# Patient Record
Sex: Female | Born: 1970 | Race: White | Hispanic: No | Marital: Married | Smoking: Current every day smoker
Health system: Southern US, Community
[De-identification: ages and names within clinical notes are randomized; demographics above are authoritative.]

## PROBLEM LIST (undated history)

## (undated) DIAGNOSIS — J449 Chronic obstructive pulmonary disease, unspecified: Secondary | ICD-10-CM

## (undated) DIAGNOSIS — N289 Disorder of kidney and ureter, unspecified: Secondary | ICD-10-CM

## (undated) DIAGNOSIS — D649 Anemia, unspecified: Secondary | ICD-10-CM

## (undated) HISTORY — PX: ORIF KNEE DISLOCATION: SUR938

## (undated) HISTORY — PX: ABDOMINAL HYSTERECTOMY: SHX81

---

## 1998-08-21 ENCOUNTER — Inpatient Hospital Stay (HOSPITAL_COMMUNITY): Admission: EM | Admit: 1998-08-21 | Discharge: 1998-08-29 | Payer: Self-pay | Admitting: *Deleted

## 1998-08-21 ENCOUNTER — Encounter: Payer: Self-pay | Admitting: *Deleted

## 1998-08-21 ENCOUNTER — Encounter: Payer: Self-pay | Admitting: General Surgery

## 1998-08-23 ENCOUNTER — Encounter: Payer: Self-pay | Admitting: General Surgery

## 2006-02-27 ENCOUNTER — Encounter: Payer: Self-pay | Admitting: *Deleted

## 2006-02-27 ENCOUNTER — Encounter: Payer: Self-pay | Admitting: Orthopedic Surgery

## 2010-01-23 DIAGNOSIS — Z720 Tobacco use: Secondary | ICD-10-CM | POA: Insufficient documentation

## 2011-02-08 DIAGNOSIS — F319 Bipolar disorder, unspecified: Secondary | ICD-10-CM | POA: Insufficient documentation

## 2011-05-07 DIAGNOSIS — D649 Anemia, unspecified: Secondary | ICD-10-CM | POA: Insufficient documentation

## 2011-10-01 DIAGNOSIS — R7301 Impaired fasting glucose: Secondary | ICD-10-CM | POA: Insufficient documentation

## 2013-07-23 ENCOUNTER — Emergency Department (HOSPITAL_COMMUNITY): Payer: Medicaid - Out of State

## 2013-07-23 ENCOUNTER — Encounter (HOSPITAL_COMMUNITY): Payer: Self-pay | Admitting: Emergency Medicine

## 2013-07-23 ENCOUNTER — Emergency Department (HOSPITAL_COMMUNITY)
Admission: EM | Admit: 2013-07-23 | Discharge: 2013-07-24 | Disposition: A | Discharge status: Home / Self Care | Payer: Medicaid - Out of State | Attending: Emergency Medicine | Admitting: Emergency Medicine

## 2013-07-23 DIAGNOSIS — D649 Anemia, unspecified: Secondary | ICD-10-CM | POA: Insufficient documentation

## 2013-07-23 DIAGNOSIS — J449 Chronic obstructive pulmonary disease, unspecified: Secondary | ICD-10-CM | POA: Insufficient documentation

## 2013-07-23 DIAGNOSIS — J4489 Other specified chronic obstructive pulmonary disease: Secondary | ICD-10-CM | POA: Insufficient documentation

## 2013-07-23 DIAGNOSIS — Z3202 Encounter for pregnancy test, result negative: Secondary | ICD-10-CM | POA: Insufficient documentation

## 2013-07-23 DIAGNOSIS — F17200 Nicotine dependence, unspecified, uncomplicated: Secondary | ICD-10-CM | POA: Insufficient documentation

## 2013-07-23 DIAGNOSIS — Z87448 Personal history of other diseases of urinary system: Secondary | ICD-10-CM | POA: Insufficient documentation

## 2013-07-23 DIAGNOSIS — IMO0002 Reserved for concepts with insufficient information to code with codable children: Secondary | ICD-10-CM | POA: Insufficient documentation

## 2013-07-23 DIAGNOSIS — K8020 Calculus of gallbladder without cholecystitis without obstruction: Secondary | ICD-10-CM | POA: Insufficient documentation

## 2013-07-23 DIAGNOSIS — Z79899 Other long term (current) drug therapy: Secondary | ICD-10-CM | POA: Insufficient documentation

## 2013-07-23 DIAGNOSIS — R109 Unspecified abdominal pain: Secondary | ICD-10-CM

## 2013-07-23 HISTORY — DX: Anemia, unspecified: D64.9

## 2013-07-23 HISTORY — DX: Disorder of kidney and ureter, unspecified: N28.9

## 2013-07-23 HISTORY — DX: Chronic obstructive pulmonary disease, unspecified: J44.9

## 2013-07-23 MED ORDER — KETOROLAC TROMETHAMINE 30 MG/ML IJ SOLN
30.0000 mg | Freq: Once | INTRAMUSCULAR | Status: AC
  Administered 2013-07-24: 30 mg via INTRAVENOUS
  Filled 2013-07-23: qty 1

## 2013-07-23 MED ORDER — HYDROMORPHONE HCL PF 1 MG/ML IJ SOLN
1.0000 mg | Freq: Once | INTRAMUSCULAR | Status: AC
  Administered 2013-07-24: 1 mg via INTRAVENOUS
  Filled 2013-07-23: qty 1

## 2013-07-23 MED ORDER — SODIUM CHLORIDE 0.9 % IV BOLUS (SEPSIS)
1000.0000 mL | Freq: Once | INTRAVENOUS | Status: AC
  Administered 2013-07-24: 1000 mL via INTRAVENOUS

## 2013-07-23 MED ORDER — ONDANSETRON HCL 4 MG/2ML IJ SOLN
4.0000 mg | Freq: Once | INTRAMUSCULAR | Status: AC
  Administered 2013-07-24: 4 mg via INTRAVENOUS
  Filled 2013-07-23: qty 2

## 2013-07-23 NOTE — ED Provider Notes (Signed)
CSN: [id number]     Arrival date & time 07/23/13  2322 History   First MD Initiated Contact with Patient 07/23/13 2333     Chief Complaint  Patient presents with  . Abdominal Pain  . Nausea  . Emesis   (Consider location/radiation/quality/duration/timing/severity/associated sxs/prior Treatment) HPI  42 year old female with abdominal pain and nausea vomiting. Symptoms have waxed and waned for several months. Currently worse the last 2 days. Pain is in the right upper quadrant/right flank with radiation into the pack. Waxes and wanes. Sometimes worse after eating, but not consistently. No fevers or chills. Has better small amount of blood in the urine. Otherwise, no urinary complaints. No unusual vaginal bleeding or discharge.  No fevers or chills. Patient reports she was seen at [facility] recently for the same complaints. She reports that she was told she likely has a kidney stone. Surgical history significant for hysterectomy. Has been taking Naprosyn with some relief.  Past Medical History  Diagnosis Date  . COPD (chronic obstructive pulmonary disease)   . Renal disorder   . Anemia    Past Surgical History  Procedure Laterality Date  . Orif knee dislocation    . Abdominal hysterectomy     History reviewed. No pertinent family history. History  Substance Use Topics  . Smoking status: Current Every Day Smoker  . Smokeless tobacco: Not on file  . Alcohol Use: Yes   OB History   Grav Para Term Preterm Abortions TAB SAB Ect Mult Living                 Review of Systems  All systems reviewed and negative, other than as noted in HPI.   Allergies  Cinnamon  Home Medications   Current Outpatient Rx  Name  Route  Sig  Dispense  Refill  . ferrous fumarate (HEMOCYTE - 106 MG FE) 325 (106 FE) MG TABS tablet   Oral   Take 1 tablet by mouth.         . folic acid (FOLVITE) 1 MG tablet   Oral   Take 1 mg by mouth daily.         . naproxen (NAPROSYN) 500 MG  tablet   Oral   Take 500 mg by mouth 2 (two) times daily with a meal.         . pantoprazole (PROTONIX) 40 MG tablet   Oral   Take 40 mg by mouth daily.          BP 119/71  Pulse 107  Temp(Src) 97.9 F (36.6 C) (Oral)  Ht 5\' 2"  (1.575 m)  Wt 168 lb (76.204 kg)  BMI 30.72 kg/m2  SpO2 99% Physical Exam  Nursing note and vitals reviewed. Constitutional: She appears well-developed and well-nourished. No distress.  HENT:  Head: Normocephalic and atraumatic.  Eyes: Conjunctivae are normal. Right eye exhibits no discharge. Left eye exhibits no discharge.  Neck: Neck supple.  Cardiovascular: Normal rate, regular rhythm and normal heart sounds.  Exam reveals no gallop and no friction rub.   No murmur heard. Pulmonary/Chest: Effort normal and breath sounds normal. No respiratory distress.  Abdominal: Soft. She exhibits no distension. There is tenderness. There is no rebound and no guarding.  Epigastric and right upper quadrant tenderness without rebound or guarding. No distention. Well-healed lower midline surgical scar. Bedside ultrasound of the right upper quadrant shows a dependent gallstone in the gallbladder body. Gallbladder neck not well visualized but no obvious stone noted. Gallbladder wall 2.1 mm. No  pericholecystic fluid. Common bile duct not clearly identified.  Genitourinary:  Right costovertebral angle tenderness.  Musculoskeletal: She exhibits no edema and no tenderness.  Neurological: She is alert.  Skin: Skin is warm and dry.  Psychiatric: She has a normal mood and affect. Her behavior is normal. Thought content normal.    ED Course  Procedures (including critical care time) Labs Review Labs Reviewed  URINALYSIS, ROUTINE W REFLEX MICROSCOPIC - Abnormal; Notable for the following:    Hgb urine dipstick SMALL (*)    All other components within normal limits  CBC WITH DIFFERENTIAL - Abnormal; Notable for the following:    WBC 11.6 (*)    Lymphs Abs 4.1 (*)     All other components within normal limits  BASIC METABOLIC PANEL - Abnormal; Notable for the following:    Glucose, Bld 108 (*)    All other components within normal limits  URINE MICROSCOPIC-ADD ON - Abnormal; Notable for the following:    Squamous Epithelial / LPF MANY (*)    Bacteria, UA MANY (*)    All other components within normal limits  HEPATIC FUNCTION PANEL - Abnormal; Notable for the following:    ALT 57 (*)    Total Bilirubin <0.1 (*)    All other components within normal limits  LIPASE, BLOOD - Abnormal; Notable for the following:    Lipase 131 (*)    All other components within normal limits  PREGNANCY, URINE   Imaging Review Ct Abdomen Pelvis Wo Contrast  07/24/2013   CLINICAL DATA:  Right flank pain, nausea and vomiting. History of urinary tract stones.  EXAM: CT ABDOMEN AND PELVIS WITHOUT CONTRAST  TECHNIQUE: Multidetector CT imaging of the abdomen and pelvis was performed following the standard protocol without intravenous contrast.  COMPARISON:  CT abdomen and pelvis 05/15/2013.  FINDINGS: The lung bases are clear. No pleural or pericardial effusion.  There are no renal or ureteral stones on the right or left. The kidneys have a normal in noncontrast appearance. The urinary bladder appears normal. The liver is diffusely low attenuating consistent with fatty infiltration. No focal liver lesion is seen. The spleen, adrenal glands, pancreas, gallbladder and biliary tree all appear normal. The patient is status post hysterectomy. The stomach, small and large bowel and appendix appear normal. There is no lymphadenopathy or fluid. No focal bony abnormality is identified.  IMPRESSION: Negative for urinary tract stone or hydronephrosis. No acute abnormality.  Diffuse fatty infiltration of the liver.   Electronically Signed   By: Drusilla Kanner M.D.   On: 07/24/2013 00:44    MDM   1. Abdominal pain   2. Cholelithiasis    42 year old female with abdominal pain. I suspect this is  secondary to biliary colic. Patient with a dependent gallstone noted on bedside ultrasound. Gallbladder wall was not thickened and there was no pericholecystic fluid. I did not get a great view of the gallbladder neck nor convincing view of the common bile duct though. Arranged for patient return for a formal ultrasound later this morning. She does have a mild elevation in her lipase/ALT. Her symptoms have been controlled though. Repeat abdominal exam prior to DC much improved. This is more consistent with transient obstruction. Urinalysis shows many bacteria but also many squamous cells. Nitrite and leukocyte negative. No specific urinary complaints. Antibiotics deferred. I feel she is stable for discharge. Noncontrast CT is without acute pathology. She is discharged with pain medication and Zofran. She understands the need to return for ultrasound later  today. Discussed the possibility that she may be developing gallstone pancreatitis. Strict return precautions discussed. Surgical follow-up contact information provided otherwise.    Raeford Razor, MD 07/24/13 [phone]

## 2013-07-23 NOTE — ED Notes (Signed)
MD at bedside. 

## 2013-07-23 NOTE — ED Notes (Signed)
Pt states this feels like a kidney stone. Pt c/o n/v with abd pain and states she had a hard stool this am.

## 2013-07-23 NOTE — ED Notes (Signed)
Pt up to restroom at this time to obtain clean catch urine specimen

## 2013-07-24 ENCOUNTER — Encounter (HOSPITAL_COMMUNITY): Payer: Self-pay

## 2013-07-24 ENCOUNTER — Emergency Department (HOSPITAL_COMMUNITY)
Admission: EM | Admit: 2013-07-24 | Discharge: 2013-07-24 | Disposition: A | Discharge status: Home / Self Care | Payer: Medicaid - Out of State | Source: Home / Self Care | Attending: Emergency Medicine | Admitting: Emergency Medicine

## 2013-07-24 ENCOUNTER — Ambulatory Visit (HOSPITAL_COMMUNITY)
Admit: 2013-07-24 | Discharge: 2013-07-24 | Disposition: A | Discharge status: Home / Self Care | Payer: Medicaid - Out of State | Attending: Emergency Medicine | Admitting: Emergency Medicine

## 2013-07-24 DIAGNOSIS — F17200 Nicotine dependence, unspecified, uncomplicated: Secondary | ICD-10-CM | POA: Insufficient documentation

## 2013-07-24 DIAGNOSIS — F411 Generalized anxiety disorder: Secondary | ICD-10-CM | POA: Insufficient documentation

## 2013-07-24 DIAGNOSIS — D649 Anemia, unspecified: Secondary | ICD-10-CM | POA: Insufficient documentation

## 2013-07-24 DIAGNOSIS — K8020 Calculus of gallbladder without cholecystitis without obstruction: Secondary | ICD-10-CM

## 2013-07-24 DIAGNOSIS — J449 Chronic obstructive pulmonary disease, unspecified: Secondary | ICD-10-CM | POA: Insufficient documentation

## 2013-07-24 DIAGNOSIS — Z3202 Encounter for pregnancy test, result negative: Secondary | ICD-10-CM | POA: Insufficient documentation

## 2013-07-24 DIAGNOSIS — Z90710 Acquired absence of both cervix and uterus: Secondary | ICD-10-CM | POA: Insufficient documentation

## 2013-07-24 DIAGNOSIS — Z79899 Other long term (current) drug therapy: Secondary | ICD-10-CM | POA: Insufficient documentation

## 2013-07-24 DIAGNOSIS — J4489 Other specified chronic obstructive pulmonary disease: Secondary | ICD-10-CM | POA: Insufficient documentation

## 2013-07-24 DIAGNOSIS — R1011 Right upper quadrant pain: Secondary | ICD-10-CM | POA: Insufficient documentation

## 2013-07-24 DIAGNOSIS — Z87448 Personal history of other diseases of urinary system: Secondary | ICD-10-CM | POA: Insufficient documentation

## 2013-07-24 LAB — URINALYSIS, ROUTINE W REFLEX MICROSCOPIC
Bilirubin Urine: NEGATIVE
Glucose, UA: NEGATIVE mg/dL
Glucose, UA: NEGATIVE mg/dL
Ketones, ur: NEGATIVE mg/dL
Ketones, ur: NEGATIVE mg/dL
Leukocytes, UA: NEGATIVE
Leukocytes, UA: NEGATIVE
Nitrite: NEGATIVE
Protein, ur: NEGATIVE mg/dL
Protein, ur: NEGATIVE mg/dL
Specific Gravity, Urine: 1.015 (ref 1.005–1.030)
Specific Gravity, Urine: >1.03 — ABNORMAL HIGH (ref 1.005–1.030)
Urobilinogen, UA: 0.2 mg/dL (ref 0.0–1.0)
pH: 6 (ref 5.0–8.0)
pH: 6 (ref 5.0–8.0)

## 2013-07-24 LAB — URINE MICROSCOPIC-ADD ON

## 2013-07-24 LAB — CBC WITH DIFFERENTIAL/PLATELET
Basophils Absolute: 0 10*3/uL (ref 0.0–0.1)
Basophils Absolute: 0 10*3/uL (ref 0.0–0.1)
Basophils Relative: 0 % (ref 0–1)
Basophils Relative: 0 % (ref 0–1)
Eosinophils Absolute: 0.2 10*3/uL (ref 0.0–0.7)
Eosinophils Relative: 2 % (ref 0–5)
HCT: 40.1 % (ref 36.0–46.0)
Hemoglobin: 13.3 g/dL (ref 12.0–15.0)
Lymphocytes Relative: 29 % (ref 12–46)
Lymphocytes Relative: 35 % (ref 12–46)
Lymphs Abs: 3.3 10*3/uL (ref 0.7–4.0)
Lymphs Abs: 4.1 10*3/uL — ABNORMAL HIGH (ref 0.7–4.0)
MCH: 31.7 pg (ref 26.0–34.0)
MCHC: 32.8 g/dL (ref 30.0–36.0)
MCHC: 33.2 g/dL (ref 30.0–36.0)
MCV: 95.5 fL (ref 78.0–100.0)
Monocytes Absolute: 0.8 10*3/uL (ref 0.1–1.0)
Monocytes Relative: 7 % (ref 3–12)
Neutro Abs: 6.5 10*3/uL (ref 1.7–7.7)
Neutro Abs: 7.1 10*3/uL (ref 1.7–7.7)
Neutrophils Relative %: 56 % (ref 43–77)
Neutrophils Relative %: 63 % (ref 43–77)
Platelets: 236 10*3/uL (ref 150–400)
RBC: 3.93 MIL/uL (ref 3.87–5.11)
RBC: 4.2 MIL/uL (ref 3.87–5.11)
RDW: 13.2 % (ref 11.5–15.5)
RDW: 13.2 % (ref 11.5–15.5)
WBC: 11.6 10*3/uL — ABNORMAL HIGH (ref 4.0–10.5)

## 2013-07-24 LAB — PREGNANCY, URINE
Preg Test, Ur: NEGATIVE
Preg Test, Ur: NEGATIVE

## 2013-07-24 LAB — COMPREHENSIVE METABOLIC PANEL
ALT: 60 U/L — ABNORMAL HIGH (ref 0–35)
AST: 44 U/L — ABNORMAL HIGH (ref 0–37)
Albumin: 3.8 g/dL (ref 3.5–5.2)
Chloride: 107 mEq/L (ref 96–112)
Creatinine, Ser: 0.79 mg/dL (ref 0.50–1.10)
GFR calc non Af Amer: >90 mL/min (ref >90)
Potassium: 3.6 mEq/L (ref 3.5–5.1)
Total Bilirubin: <0.1 mg/dL — ABNORMAL LOW (ref 0.3–1.2)

## 2013-07-24 LAB — BASIC METABOLIC PANEL
BUN: 10 mg/dL (ref 6–23)
CO2: 25 mEq/L (ref 19–32)
Calcium: 9.2 mg/dL (ref 8.4–10.5)
Chloride: 107 mEq/L (ref 96–112)
Creatinine, Ser: 0.73 mg/dL (ref 0.50–1.10)
GFR calc Af Amer: >90 mL/min (ref >90)
GFR calc non Af Amer: >90 mL/min (ref >90)
Glucose, Bld: 108 mg/dL — ABNORMAL HIGH (ref 70–99)
Potassium: 3.8 mEq/L (ref 3.5–5.1)
Sodium: 142 mEq/L (ref 135–145)

## 2013-07-24 LAB — HEPATIC FUNCTION PANEL
ALT: 57 U/L — ABNORMAL HIGH (ref 0–35)
AST: 33 U/L (ref 0–37)
Albumin: 4.2 g/dL (ref 3.5–5.2)
Alkaline Phosphatase: 71 U/L (ref 39–117)
Bilirubin, Direct: <0.1 mg/dL (ref 0.0–0.3)
Total Bilirubin: <0.1 mg/dL — ABNORMAL LOW (ref 0.3–1.2)
Total Protein: 7 g/dL (ref 6.0–8.3)

## 2013-07-24 LAB — LIPASE, BLOOD: Lipase: 131 U/L — ABNORMAL HIGH (ref 11–59)

## 2013-07-24 MED ORDER — ONDANSETRON HCL 4 MG PO TABS: 4.0000 mg | ORAL_TABLET | Freq: Four times a day (QID) | ORAL | Status: AC

## 2013-07-24 MED ORDER — OXYCODONE-ACETAMINOPHEN 5-325 MG PO TABS: 1.0000 | ORAL_TABLET | ORAL | Status: AC | PRN

## 2013-07-24 MED ORDER — HYDROMORPHONE HCL PF 1 MG/ML IJ SOLN
1.0000 mg | Freq: Once | INTRAMUSCULAR | Status: AC
  Administered 2013-07-24: 1 mg via INTRAVENOUS
  Filled 2013-07-24: qty 1

## 2013-07-24 MED ORDER — ONDANSETRON HCL 4 MG/2ML IJ SOLN
4.0000 mg | Freq: Once | INTRAMUSCULAR | Status: AC
  Administered 2013-07-24: 4 mg via INTRAVENOUS

## 2013-07-24 MED ORDER — ONDANSETRON HCL 4 MG/2ML IJ SOLN
4.0000 mg | Freq: Once | INTRAMUSCULAR | Status: DC
  Filled 2013-07-24: qty 2

## 2013-07-24 MED ORDER — PROMETHAZINE HCL 12.5 MG PO TABS: 12.5000 mg | ORAL_TABLET | Freq: Four times a day (QID) | ORAL | Status: AC | PRN

## 2013-07-24 MED ORDER — SODIUM CHLORIDE 0.9 % IV BOLUS (SEPSIS)
1000.0000 mL | Freq: Once | INTRAVENOUS | Status: AC
  Administered 2013-07-24: 1000 mL via INTRAVENOUS

## 2013-07-24 NOTE — ED Notes (Signed)
Pt given ginger ale and peanut butter crackers. 

## 2013-07-24 NOTE — ED Provider Notes (Signed)
CSN: [id number]     Arrival date & time 07/24/13  1746 History   This chart was scribed for Glynn Octave, MD by Bennett Scrape, ED Scribe. This patient was seen in room APA15/AP[facility]15 and the patient's care was started at 6:28 PM.   Chief Complaint  Patient presents with  . Cholelithiasis  . Nausea  . Emesis    The history is provided by the patient. No language interpreter was used.    HPI Comments: Melinda Coleman is a 42 y.o. female who presents to the Emergency Department complaining of intermittent epigastric pain that radiates into the RUQ and to the right flank for the past several months that has been worsening over the past 3 to 4 days.  She reports 3 episodes of emesis today. She states that the pain is intermittently worsened with eating. She denies any other modifying factors. She reports that she has not eaten since the pain started and admits that her last normal BM was three days ago.  The pain become more severe yesterday while visiting her sister's at which point she decided to be evaluated. She was seen for the same in the ED last night with a relatively no findings. She has a negative [place] at bedside for gallstones and her non contrast abdominal CT was negative for stones and infection. She states that she was also evaluated at [facility] for the same during the original onset when she has having mild hematuria and was told to f/u with an OB-GYN but denies that she has. She denies hematuria and any other urinary symptoms currently.  She has a h/o total abdominal hysterectomy but denies any other abdominal surgeries.  Pt denies having a PCP currently  Past Medical History  Diagnosis Date  . COPD (chronic obstructive pulmonary disease)   . Renal disorder   . Anemia    Past Surgical History  Procedure Laterality Date  . Orif knee dislocation    . Abdominal hysterectomy     No family history on file. History  Substance Use Topics  . Smoking status: Current Every Day Smoker   . Smokeless tobacco: Not on file  . Alcohol Use: Yes   No OB history provided.  Review of Systems  A complete 10 system review of systems was obtained and all systems are negative except as noted in the HPI and PMH.   Allergies  Cinnamon-confirmed by pt at bedside  Home Medications   Current Outpatient Rx  Name  Route  Sig  Dispense  Refill  . ferrous fumarate (HEMOCYTE - 106 MG FE) 325 (106 FE) MG TABS tablet   Oral   Take 1 tablet by mouth.         . folic acid (FOLVITE) 1 MG tablet   Oral   Take 1 mg by mouth daily.         . naproxen (NAPROSYN) 500 MG tablet   Oral   Take 500 mg by mouth 2 (two) times daily with a meal.         . ondansetron (ZOFRAN) 4 MG tablet   Oral   Take 1 tablet (4 mg total) by mouth every 6 (six) hours.   12 tablet   0   . oxyCODONE-acetaminophen (PERCOCET/ROXICET) 5-325 MG per tablet   Oral   Take 1-2 tablets by mouth every 4 (four) hours as needed for pain.   15 tablet   0   . oxyCODONE-acetaminophen (PERCOCET/ROXICET) 5-325 MG per tablet   Oral  Take 1-2 tablets by mouth every 4 (four) hours as needed for pain.   6 tablet   0   . pantoprazole (PROTONIX) 40 MG tablet   Oral   Take 40 mg by mouth daily.         . promethazine (PHENERGAN) 12.5 MG tablet   Oral   Take 1 tablet (12.5 mg total) by mouth every 6 (six) hours as needed for nausea.   30 tablet   0    Triage Vitals: BP 100/60  Pulse 96  Temp(Src) 98.1 F (36.7 C) (Oral)  Resp 20  Ht 5\' 2"  (1.575 m)  Wt 168 lb (76.204 kg)  BMI 30.72 kg/m2  SpO2 100%  Physical Exam  Nursing note and vitals reviewed. Constitutional: She is oriented to person, place, and time. She appears well-developed and well-nourished. No distress.  Tearful, anxious   HENT:  Head: Normocephalic and atraumatic.  Eyes: Conjunctivae and EOM are normal.  Neck: Normal range of motion. Neck supple. No tracheal deviation present.  Cardiovascular: Normal rate, regular rhythm and normal  heart sounds.   No murmur heard. Pulmonary/Chest: Effort normal and breath sounds normal. No respiratory distress. She has no wheezes. She has no rales.  Abdominal: Soft. Bowel sounds are normal. There is tenderness (RUQ and epigastric tenderness). There is no rebound and no guarding.  Mild right CVA tenderness, well-healed midline surgical scar  Musculoskeletal: Normal range of motion. She exhibits no edema.  Neurological: She is alert and oriented to person, place, and time. No cranial nerve deficit.  Skin: Skin is warm and dry.  Psychiatric: Her behavior is normal. Her mood appears anxious.    ED Course  Procedures (including critical care time)  Medications  ondansetron (ZOFRAN) injection 4 mg (not administered)  sodium chloride 0.9 % bolus 1,000 mL (0 mLs Intravenous Stopped 07/24/13 1956)  sodium chloride 0.9 % bolus 1,000 mL (0 mLs Intravenous Stopped 07/24/13 1913)  ondansetron (ZOFRAN) injection 4 mg (4 mg Intravenous Given 07/24/13 1837)  HYDROmorphone (DILAUDID) injection 1 mg (1 mg Intravenous Given 07/24/13 1840)    DIAGNOSTIC STUDIES: Oxygen Saturation is 100% on room air, normal by my interpretation.    COORDINATION OF CARE: 6:31 PM-Discussed treatment plan which includes medications, CBC panel, CMP and UA with pt at bedside and pt agreed to plan.   Labs Review Labs Reviewed  CBC WITH DIFFERENTIAL - Abnormal; Notable for the following:    WBC 11.4 (*)    All other components within normal limits  COMPREHENSIVE METABOLIC PANEL - Abnormal; Notable for the following:    Glucose, Bld 100 (*)    AST 44 (*)    ALT 60 (*)    Total Bilirubin <0.1 (*)    All other components within normal limits  URINALYSIS, ROUTINE W REFLEX MICROSCOPIC - Abnormal; Notable for the following:    APPearance CLOUDY (*)    Specific Gravity, Urine >1.030 (*)    Hgb urine dipstick TRACE (*)    All other components within normal limits  URINE MICROSCOPIC-ADD ON - Abnormal; Notable for the  following:    Squamous Epithelial / LPF MANY (*)    Bacteria, UA MANY (*)    All other components within normal limits  URINE CULTURE  LIPASE, BLOOD  PREGNANCY, URINE   Imaging Review Ct Abdomen Pelvis Wo Contrast  07/24/2013   CLINICAL DATA:  Right flank pain, nausea and vomiting. History of urinary tract stones.  EXAM: CT ABDOMEN AND PELVIS WITHOUT CONTRAST  TECHNIQUE: Multidetector  CT imaging of the abdomen and pelvis was performed following the standard protocol without intravenous contrast.  COMPARISON:  CT abdomen and pelvis 05/15/2013.  FINDINGS: The lung bases are clear. No pleural or pericardial effusion.  There are no renal or ureteral stones on the right or left. The kidneys have a normal in noncontrast appearance. The urinary bladder appears normal. The liver is diffusely low attenuating consistent with fatty infiltration. No focal liver lesion is seen. The spleen, adrenal glands, pancreas, gallbladder and biliary tree all appear normal. The patient is status post hysterectomy. The stomach, small and large bowel and appendix appear normal. There is no lymphadenopathy or fluid. No focal bony abnormality is identified.  IMPRESSION: Negative for urinary tract stone or hydronephrosis. No acute abnormality.  Diffuse fatty infiltration of the liver.   Electronically Signed   By: Drusilla Kanner M.D.   On: 07/24/2013 00:44   US Abdomen Limited Ruq/gall Gladder  07/24/2013   CLINICAL DATA:  Right upper quadrant pain. Cholelithiasis.  EXAM: US ABDOMEN LIMITED - RIGHT UPPER QUADRANT  COMPARISON:  None.  FINDINGS: Gallbladder:  A 1.8 cm gallstone is seen. There is no evidence of gallbladder dilatation, wall thickening, or pericholecystic fluid. No sonographic Murphy's sign noted by the sonographer.  Common bile duct  Diameter: 4 mm.  Liver:  Diffusely increased echogenicity of the hepatic parenchyma, consistent with hepatic steatosis. No focal mass lesion identified.  IMPRESSION: Cholelithiasis,  without sonographic signs of cholecystitis or biliary dilatation.  Hepatic steatosis.   Electronically Signed   By: Myles Rosenthal   On: 07/24/2013 11:53    MDM   1. Cholelithiasis    Issue with ongoing abdominal pain, nausea and vomiting for several months worse in the past 2 days. Seen last night./ Had a negative CT scan. Ultrasound showed 1.8 cm gallstone without gallbladder wall thickening.  LFTs and white cell count stable. Lipase much improved and now within normal limits. No evidence of gallstone pancreatitis.  Patient with recent CT scan and ultrasound showed cholelithiasis without evidence of cholecystitis. UA appears to be contaminated. No evidence of UTI.   Patient's pain is controlled in the ED and she is tolerating by mouth. She did not fill her prescriptions from last night. She is stable to followup with the surgeon as scheduled. No evidence of cholecystitis, cholangitis or pancreatitis  I personally performed the services described in this documentation, which was scribed in my presence. The recorded information has been reviewed and is accurate.   Glynn Octave, MD 07/24/13 2020

## 2013-07-24 NOTE — ED Notes (Signed)
MD at bedside. 

## 2013-07-24 NOTE — ED Provider Notes (Signed)
Discussed ultrasound report with patient. 1.8 cm gallstone without cholecystitis.  Referral to general surgery   Donnetta Hutching, MD 07/24/13 1306

## 2013-07-24 NOTE — ED Notes (Signed)
Pt was seen in the ed yesterday and d/x w/ gallstones, cont. To have pain, nausea and vomiting. No fever or diarrhea.

## 2013-07-24 NOTE — ED Notes (Signed)
Pt provided crackers and water after ok from MD

## 2013-07-27 MED FILL — Oxycodone w/ Acetaminophen Tab 5-325 MG: ORAL | Qty: 6 | Status: AC

## 2013-07-28 ENCOUNTER — Telehealth (HOSPITAL_COMMUNITY): Payer: Self-pay | Admitting: Emergency Medicine

## 2013-07-28 LAB — URINE CULTURE

## 2013-07-29 NOTE — Progress Notes (Signed)
ED Antimicrobial Stewardship Positive Culture Follow Up   Waldine Zenz is an 42 y.o. female who presented to [facility] on 07/24/2013 with a chief complaint of  Chief Complaint  Patient presents with  . Cholelithiasis  . Nausea  . Emesis    Recent Results (from the past 720 hour(s))  URINE CULTURE     Status: None   Collection Time    07/24/13  7:14 PM      Result Value Range Status   Specimen Description URINE, CLEAN CATCH   Final   Special Requests NONE   Final   Culture  Setup Time     Final   Value: 07/26/2013 04:22     Performed at [facility] Count     Final   Value: >=100,000 COLONIES/ML     Performed at [facility]   Culture     Final   Value: ESCHERICHIA COLI     Note: Confirmed Extended Spectrum Beta-Lactamase Producer (ESBL) CRITICAL RESULT CALLED TO, READ BACK BY AND VERIFIED WITH: JANET GREENE ON 07/28/2013 AT 9:21P.M. ZOXWR     Performed at [facility]   Report Status 07/28/2013 FINAL   Final   Organism ID, Bacteria ESCHERICHIA COLI   Final    [x]  Patient discharged originally without antimicrobial agent and treatment is now indicated  New antibiotic prescription: Septra DS 1 Tab PO BID x 7 days  ED Provider: Jaynie Crumble, PA-C   Sallee Provencal 07/29/2013, 11:38 AM Infectious Diseases Pharmacist Phone# [phone]

## 2013-07-29 NOTE — ED Notes (Addendum)
Post ED Visit - Positive Culture Follow-up: Successful Patient Follow-Up  Culture assessed and recommendations reviewed by: []  Wes Dulaney, Pharm.D., BCPS []  Celedonio Miyamoto, Pharm.D., BCPS []  Georgina Pillion, Pharm.D., BCPS []  [place], [address].D., BCPS, AAHIVP [x]  Estella Husk, Pharm.D., BCPS, AAHIVP  Positive urine culture   []  Patient discharged without antimicrobial prescription and treatment is now indicated [x]  Organism is resistant to prescribed ED discharge antimicrobial []  Patient with positive blood cultures  Changes discussed with ED provider: Orlean Bradford New antibiotic prescription Septra DS 1 po BID x 7 days Attempt made to contact patient no answer.   Larena Sox 07/29/2013, 7:24 PM

## 2013-08-02 ENCOUNTER — Telehealth (HOSPITAL_COMMUNITY): Payer: Self-pay | Admitting: Emergency Medicine

## 2013-08-02 NOTE — ED Notes (Signed)
Unable to contact patient via phone. Sent letter. °

## 2013-08-07 NOTE — ED Notes (Signed)
Patient called. Patient called and informed of dx and need for additional tx. Rx called to [facility] @ [phone]  And given to pharmacist per [facility] @ [phone]

## 2014-04-26 ENCOUNTER — Emergency Department (HOSPITAL_COMMUNITY)
Admission: EM | Admit: 2014-04-26 | Discharge: 2014-04-26 | Disposition: A | Discharge status: Home / Self Care | Payer: Medicaid - Out of State

## 2015-03-04 IMAGING — CT CT ABD-PELV W/O CM
2 of 3 series · 9 of 46 positions shown, 11 images · non-contrast
Comparison: CT abdomen and pelvis 05/15/2013.

CLINICAL DATA: Right flank pain, nausea and vomiting. History of
urinary tract stones.

EXAM:
CT ABDOMEN AND PELVIS WITHOUT CONTRAST
TECHNIQUE: Multidetector CT imaging of the abdomen and pelvis was performed
following the standard protocol without intravenous contrast.

[Series 4: mpr coronal (id) · coronal · 0.72mm/px · 8 of 102 slices shown, 9 images]
[im 12/102  soft-tissue]
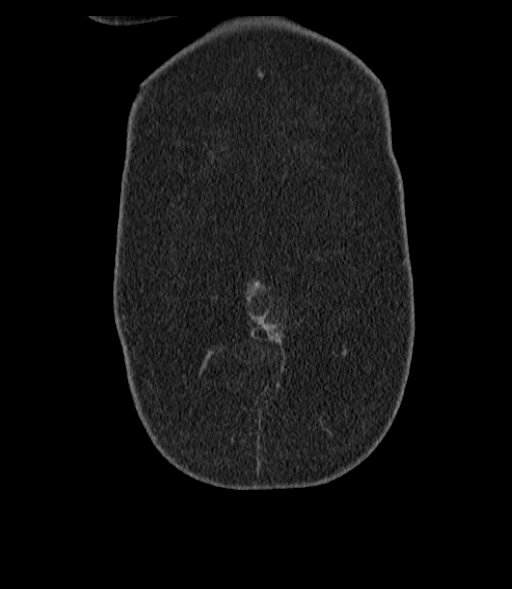
[im 12/102  bone]
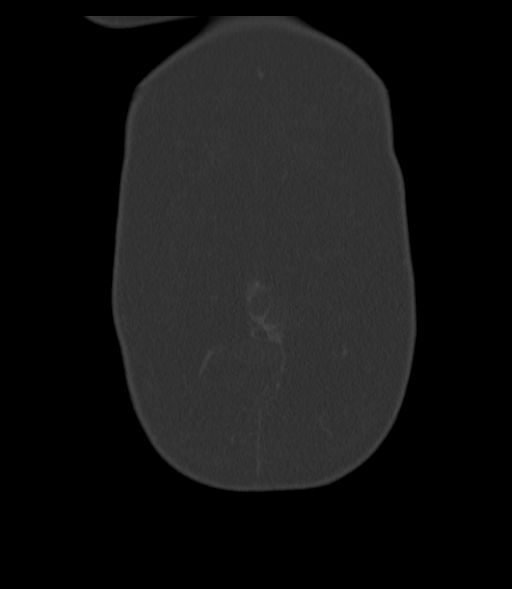
[im 23/102  soft-tissue]
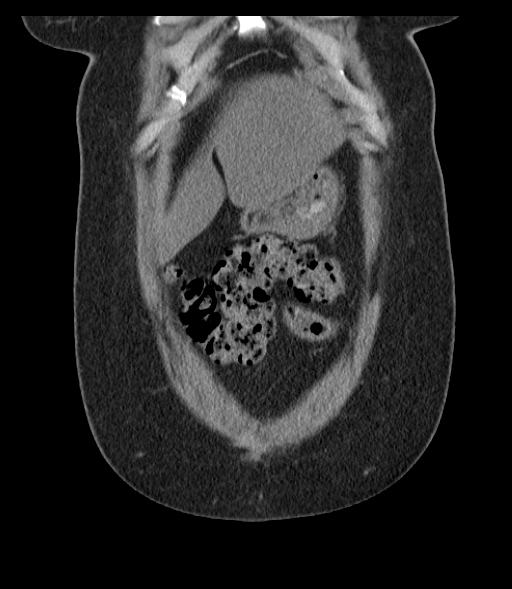
[im 34/102  soft-tissue]
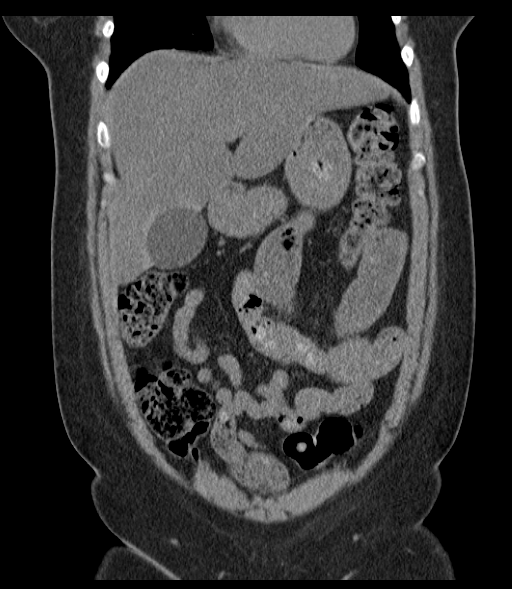
[im 45/102  soft-tissue]
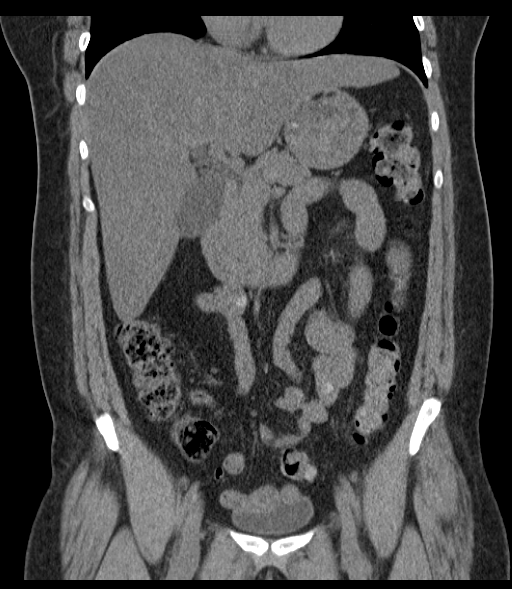
[im 57/102  soft-tissue]
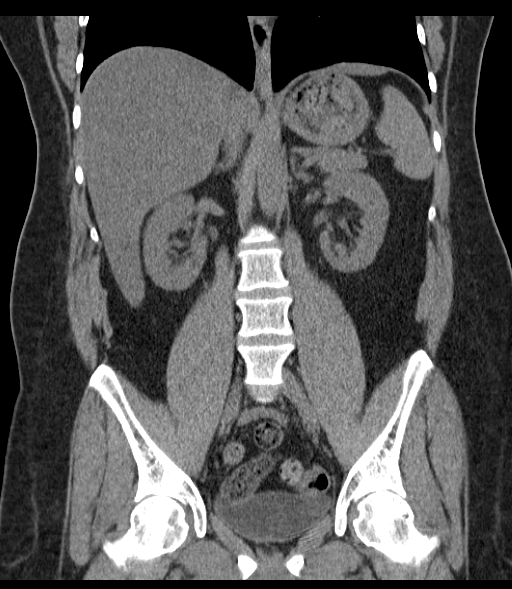
[im 68/102  soft-tissue]
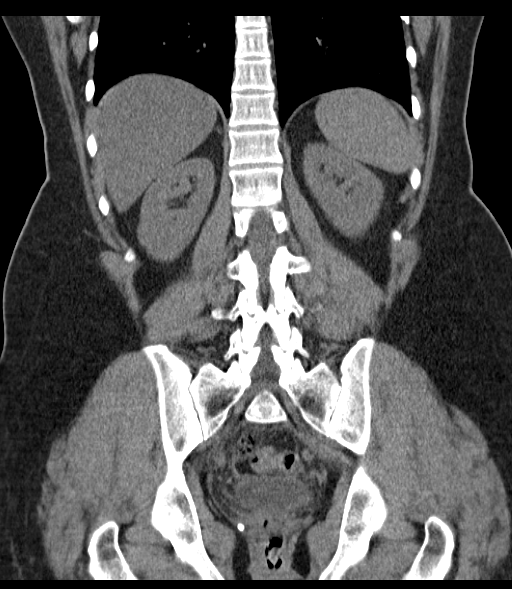
[im 79/102  soft-tissue]
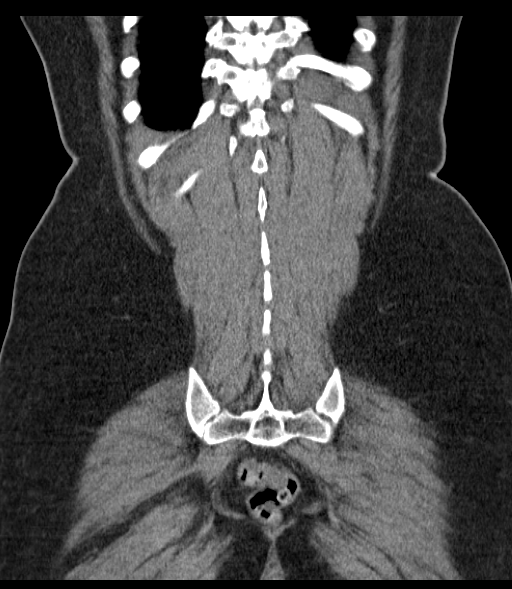
[im 90/102  soft-tissue]
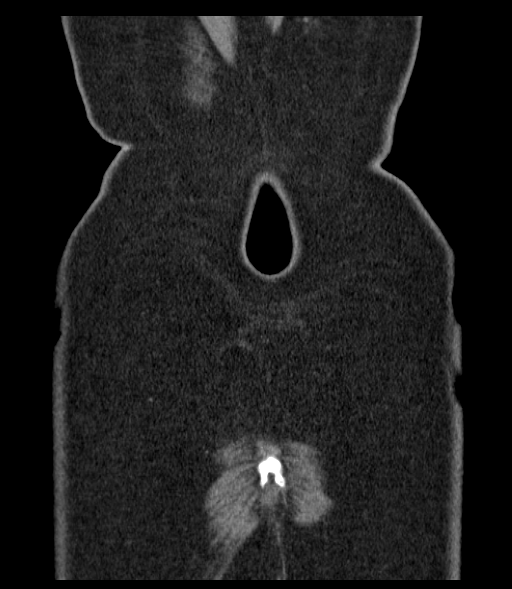

[Series 5: mpr sagittal (id) · sagittal · 0.56mm/px · 1 of 113 slices shown, 2 images]
[im 38/113  soft-tissue]
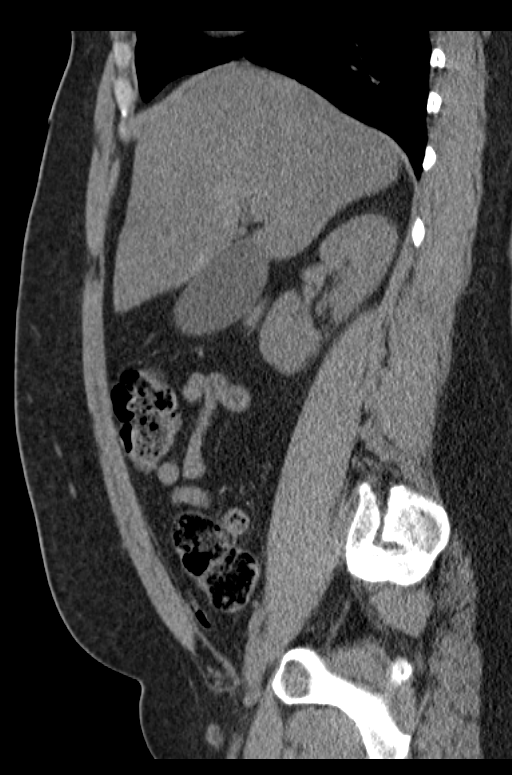
[im 38/113  bone]
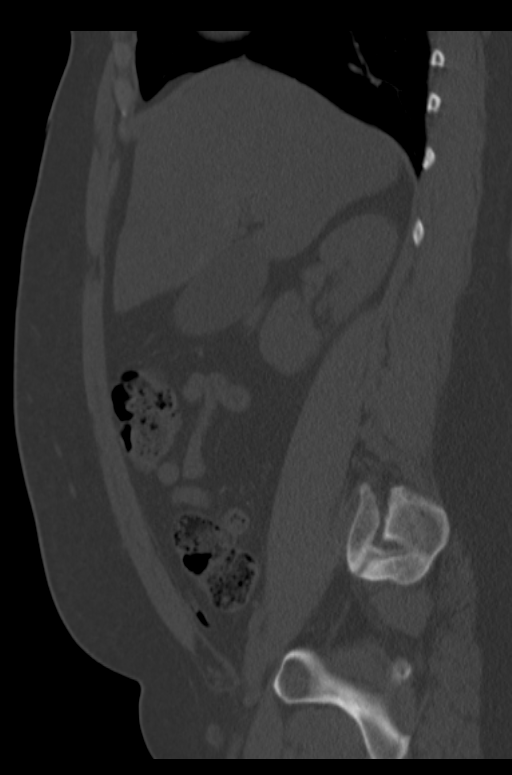

[9 of 46 positions shown; findings below may reference images not displayed]

FINDINGS: The lung bases are clear. No pleural or pericardial effusion.

There are no renal or ureteral stones on the right or left. The
kidneys have a normal in noncontrast appearance. The urinary bladder
appears normal. The liver is diffusely low attenuating consistent
with fatty infiltration. No focal liver lesion is seen. The spleen,
adrenal glands, pancreas, gallbladder and biliary tree all appear
normal. The patient is status post hysterectomy. The stomach, small
and large bowel and appendix appear normal. There is no
lymphadenopathy or fluid. No focal bony abnormality is identified.
IMPRESSION: Negative for urinary tract stone or hydronephrosis. No acute
abnormality.

Diffuse fatty infiltration of the liver.

## 2016-09-17 DIAGNOSIS — J449 Chronic obstructive pulmonary disease, unspecified: Secondary | ICD-10-CM | POA: Insufficient documentation

## 2017-01-17 DIAGNOSIS — G40909 Epilepsy, unspecified, not intractable, without status epilepticus: Secondary | ICD-10-CM | POA: Insufficient documentation

## 2017-06-14 DIAGNOSIS — E785 Hyperlipidemia, unspecified: Secondary | ICD-10-CM | POA: Insufficient documentation

## 2017-06-14 DIAGNOSIS — K219 Gastro-esophageal reflux disease without esophagitis: Secondary | ICD-10-CM | POA: Insufficient documentation

## 2018-02-13 DIAGNOSIS — F1210 Cannabis abuse, uncomplicated: Secondary | ICD-10-CM | POA: Insufficient documentation

## 2018-11-13 DIAGNOSIS — M5412 Radiculopathy, cervical region: Secondary | ICD-10-CM | POA: Insufficient documentation

## 2019-05-09 DIAGNOSIS — M7989 Other specified soft tissue disorders: Secondary | ICD-10-CM | POA: Insufficient documentation

## 2020-09-15 DIAGNOSIS — R0789 Other chest pain: Secondary | ICD-10-CM | POA: Insufficient documentation

## 2022-01-18 ENCOUNTER — Encounter (HOSPITAL_COMMUNITY): Payer: Self-pay | Admitting: Emergency Medicine

## 2022-01-18 DIAGNOSIS — M79601 Pain in right arm: Secondary | ICD-10-CM | POA: Insufficient documentation
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
## 2022-01-19 ENCOUNTER — Emergency Department (HOSPITAL_COMMUNITY)
Admission: EM | Admit: 2022-01-19 | Discharge: 2022-01-19 | Disposition: A | Discharge status: Home / Self Care | Payer: Medicaid Other | Attending: Emergency Medicine | Admitting: Emergency Medicine

## 2022-01-19 DIAGNOSIS — M79601 Pain in right arm: Secondary | ICD-10-CM
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
## 2022-01-19 NOTE — ED Provider Notes (Signed)
? EMERGENCY DEPARTMENT ?Provider Note ? ? ?CSN: [id number] ?Arrival date & time: 01/18/22  2159 ? ?  ? ?History ? ?Chief Complaint  ?Patient presents with  ? Arm Pain  ? ? ?Melinda Coleman is a 51 y.o. female. ? ?Patient with acute on chronic right-sided arm pain.  Patient states that its been hurting for quite a while and been dealing with that but over the last few days she had intermittent episodes of pretty significant edema which is new for her.  She is worried she might have a blood clot.  No new injuries.  No other new symptoms.  Her heart rate was high when she got here and when asked about she states that she was really nervous because her sister got her scared to death. ? ? ?Arm Pain ? ? ?  ? ?Home Medications ?Prior to Admission medications   ?Medication Sig Start Date End Date Taking? Authorizing Provider  ?busPIRone (BUSPAR) 15 MG tablet Take by mouth. 04/05/19  Yes [provider]  ?divalproex (DEPAKOTE) 500 MG DR tablet TAKE 1 TABLET BY MOUTH every DAILY. 04/11/19  Yes [provider]  ?fenofibrate (TRICOR) 48 MG tablet Take by mouth. 05/06/19  Yes [provider]  ?ALPRAZolam Prudy Feeler) 0.25 MG tablet Take by mouth.    [provider]  ?ferrous fumarate (HEMOCYTE - 106 MG FE) 325 (106 FE) MG TABS tablet Take 1 tablet by mouth.    [provider]  ?ferrous sulfate 300 (60 Fe) MG/5ML syrup Take by mouth.    [provider]  ?folic acid (FOLVITE) 1 MG tablet Take 1 mg by mouth daily.    [provider]  ?gabapentin (NEURONTIN) 300 MG capsule Take by mouth.    [provider]  ?naproxen (NAPROSYN) 500 MG tablet Take 500 mg by mouth 2 (two) times daily with a meal.    [provider]  ?ondansetron (ZOFRAN) 4 MG tablet Take 1 tablet (4 mg total) by mouth every 6 (six) hours. 07/24/13   Raeford Razor, MD  ?oxyCODONE-acetaminophen (PERCOCET/ROXICET) 5-325 MG per tablet Take 1-2 tablets by mouth every 4 (four) hours as needed for  pain. 07/24/13   Raeford Razor, MD  ?oxyCODONE-acetaminophen (PERCOCET/ROXICET) 5-325 MG per tablet Take 1-2 tablets by mouth every 4 (four) hours as needed for pain. 07/24/13   Raeford Razor, MD  ?pantoprazole (PROTONIX) 40 MG tablet Take 40 mg by mouth daily.    [provider]  ?promethazine (PHENERGAN) 12.5 MG tablet Take 1 tablet (12.5 mg total) by mouth every 6 (six) hours as needed for nausea. 07/24/13   Glynn Octave, MD  ?   ? ?Allergies    ?Cinnamon   ? ?Review of Systems   ?Review of Systems ? ?Physical Exam ?Updated Vital Signs ?BP (!) 135/55 (BP Location: Left Arm)   Pulse 93   Temp 98.1 ?F (36.7 ?C) (Oral)   Resp 18   Ht 5\' 2"  (1.575 m)   Wt 76.2 kg   SpO2 100%   BMI 30.73 kg/m?  ?Physical Exam ?Vitals and nursing note reviewed.  ?Constitutional:   ?   Appearance: She is well-developed.  ?HENT:  ?   Head: Normocephalic and atraumatic.  ?   Mouth/Throat:  ?   Mouth: Mucous membranes are moist.  ?   Pharynx: Oropharynx is clear.  ?Eyes:  ?   Pupils: Pupils are equal, round, and reactive to light.  ?Cardiovascular:  ?   Rate and Rhythm: Normal rate and regular rhythm.  ?  Comments: Right radial pulse intact ?Pulmonary:  ?   Effort: No respiratory distress.  ?   Breath sounds: No stridor.  ?Abdominal:  ?   General: Abdomen is flat. There is no distension.  ?Musculoskeletal:     ?   General: No swelling or tenderness. Normal range of motion.  ?   Cervical back: Normal range of motion.  ?Skin: ?   General: Skin is warm and dry.  ?Neurological:  ?   General: No focal deficit present.  ?   Mental Status: She is alert.  ? ? ?ED Results / Procedures / Treatments   ?Labs ?(all labs ordered are listed, but only abnormal results are displayed) ?Labs Reviewed - No data to display ? ?EKG ?None ? ?Radiology ?No results found. ? ?Procedures ?Procedures  ? ? ?Medications Ordered in ED ?Medications - No data to display ? ?ED Course/ Medical Decision Making/ A&P ?  ?                        ?Medical  Decision Making ? ?Patient's symptoms sound like radiculopathy however she states she has had some swelling so we will go ahead and order an outpatient ultrasound.  She will need PCP follow-up for same. otherwise will resume symptomatic care. No trauma or other red flags necessitating ct/xr for same.  ? ?Final Clinical Impression(s) / ED Diagnoses ?Final diagnoses:  ?Right arm pain  ? ? ?Rx / DC Orders ?ED Discharge Orders   ? ?      Ordered  ?  US Venous Img Upper Uni Right       ? 01/19/22 0330  ? ?  ?  ? ?  ? ? ?  ?Marily Memos, MD ?01/19/22 [phone] ? ?

## 2022-01-29 ENCOUNTER — Ambulatory Visit (HOSPITAL_COMMUNITY)
Admission: RE | Admit: 2022-01-29 | Discharge: 2022-01-29 | Disposition: A | Discharge status: Home / Self Care | Payer: Medicaid Other | Source: Ambulatory Visit | Attending: Emergency Medicine | Admitting: Emergency Medicine

## 2022-01-29 DIAGNOSIS — M79601 Pain in right arm: Secondary | ICD-10-CM | POA: Diagnosis present

## 2023-09-09 IMAGING — US US EXTREM  UP VENOUS*R*
1 series · 13 of 24 positions shown · non-contrast
Comparison: None.

CLINICAL DATA: Right arm pain for 3 weeks.



[Series 1: us venous img upper uni right (dvt) · portal-venous · 13 of 41 slices shown]
[im 1/41]
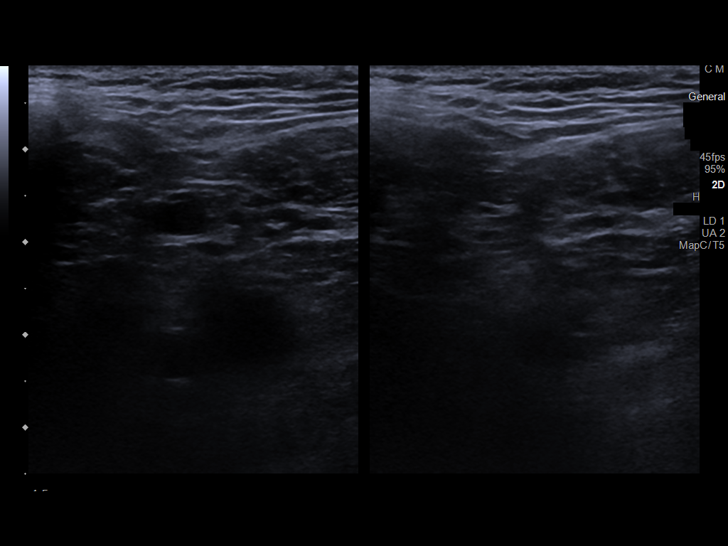
[im 4/41]
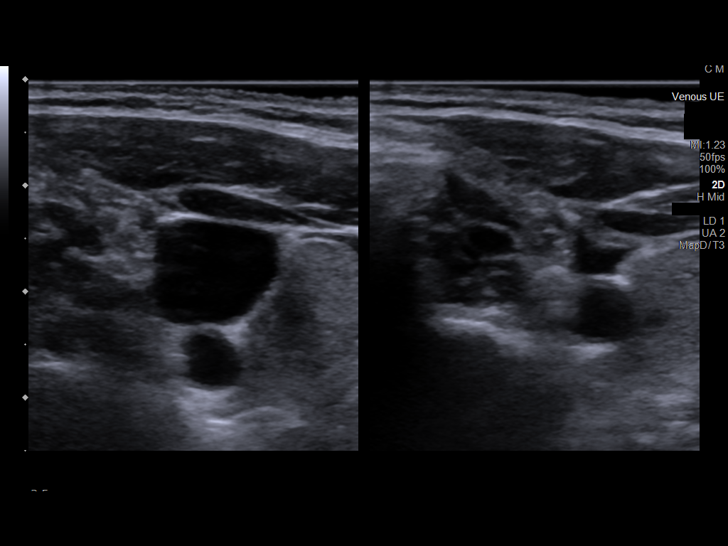
[im 7/41]
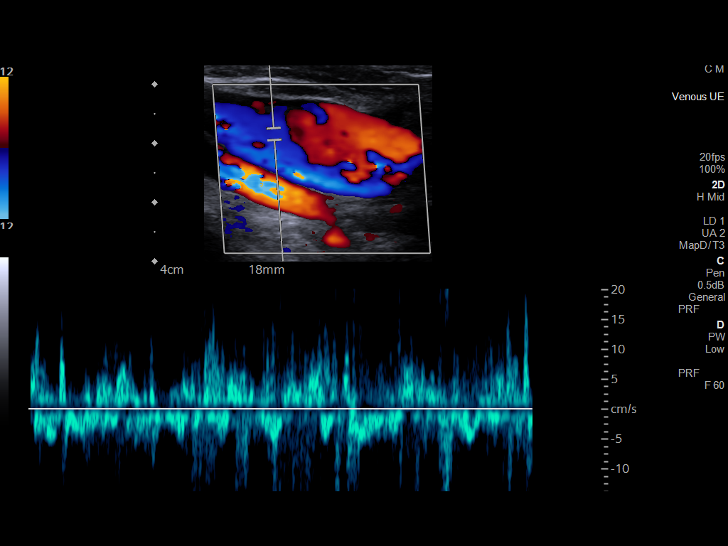
[im 11/41]
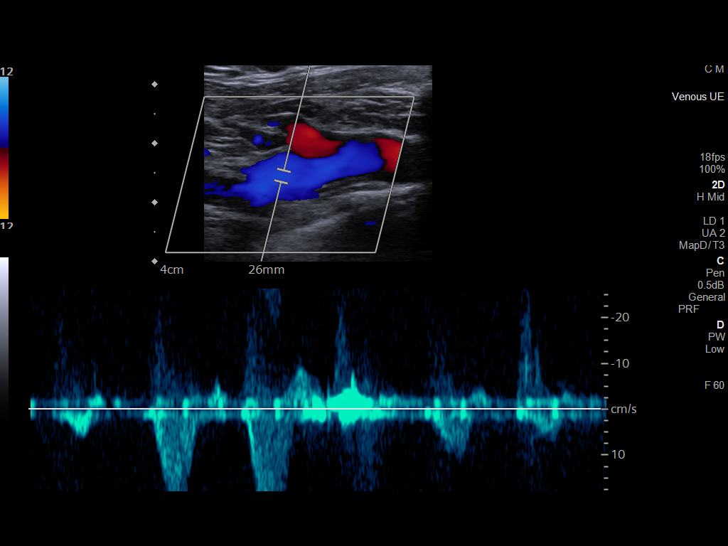
[im 14/41]
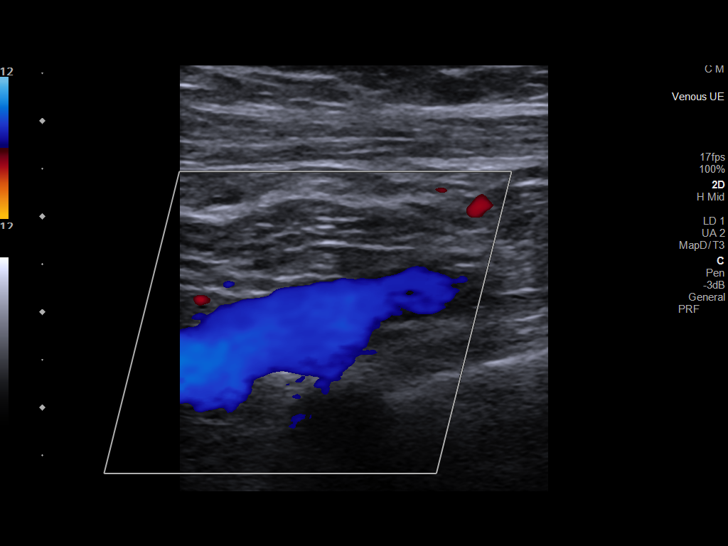
[im 18/41]
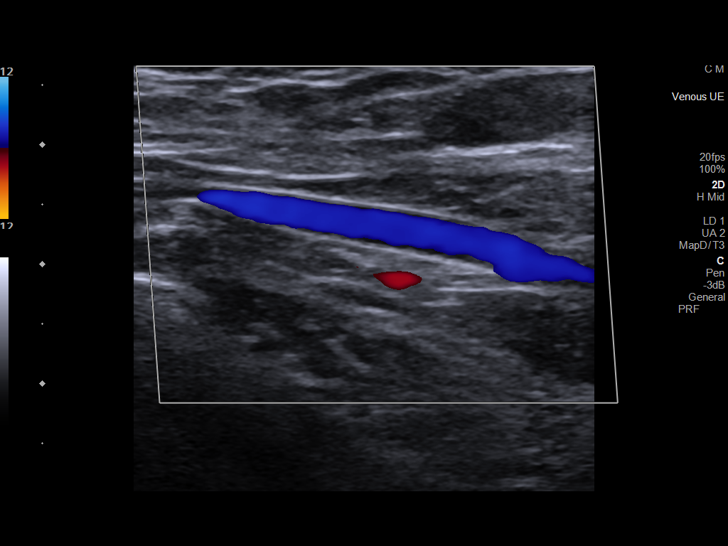
[im 21/41]
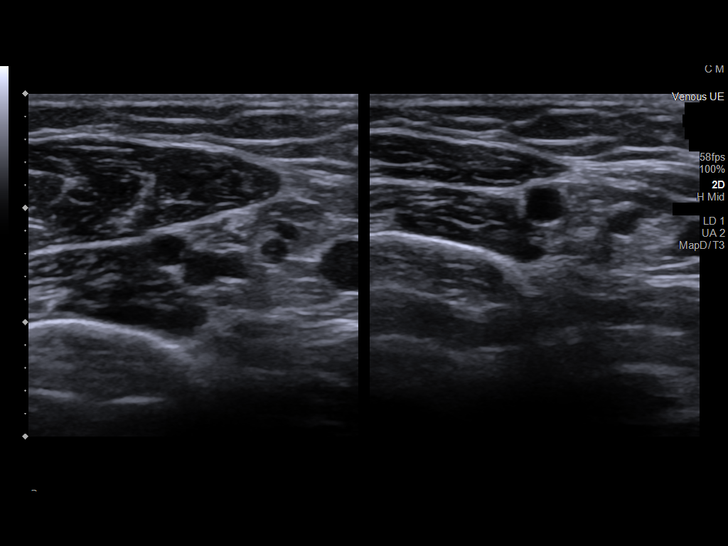
[im 23/41]
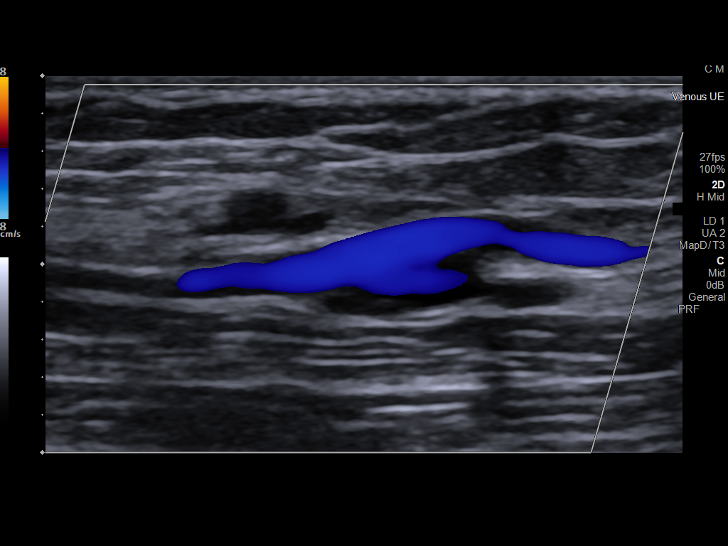
[im 27/41]
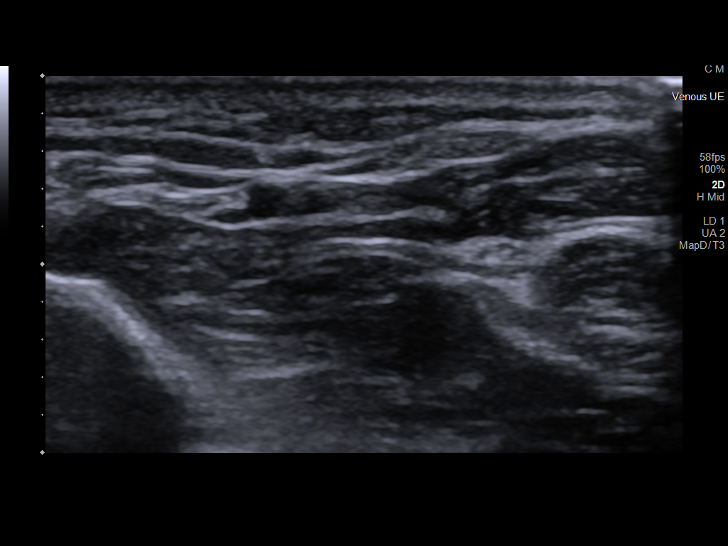
[im 30/41]
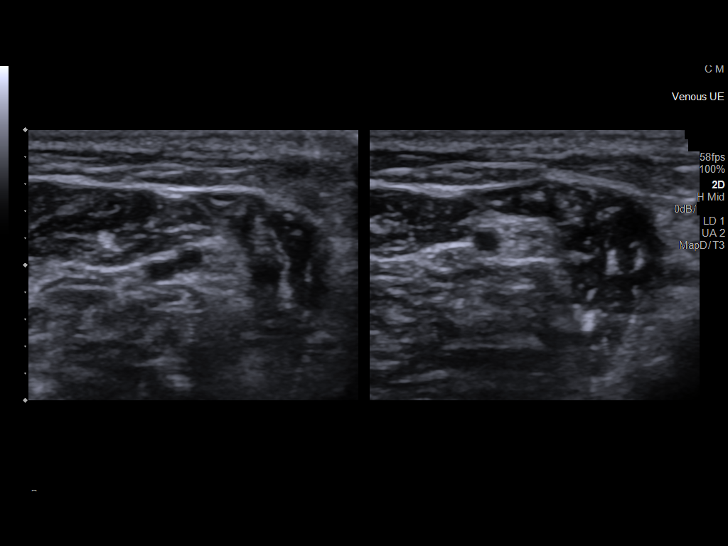
[im 34/41]
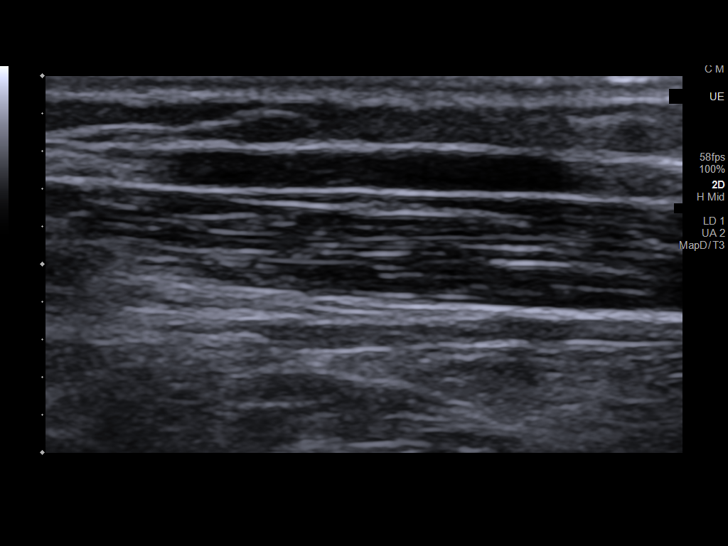
[im 37/41]
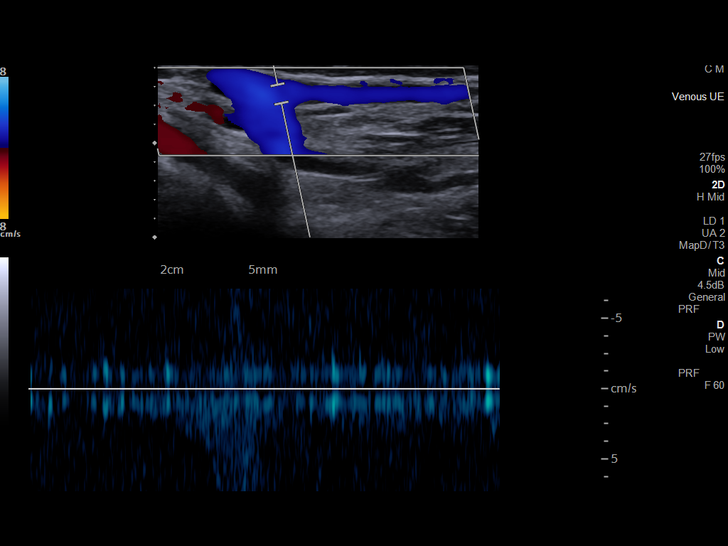
[im 41/41]
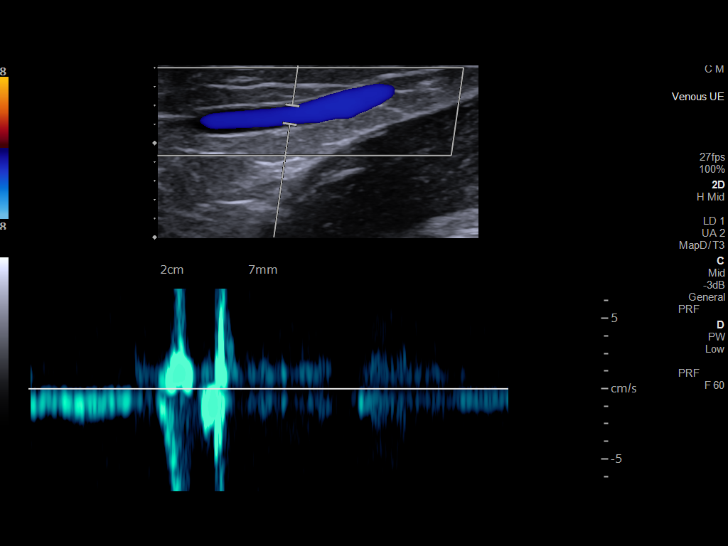

[13 of 24 positions shown; findings below may reference images not displayed]

FINDINGS: Contralateral Subclavian Vein: Respiratory phasicity is normal and
symmetric with the symptomatic side. No evidence of thrombus. Normal
compressibility.

Internal Jugular Vein: No evidence of thrombus. Normal
compressibility, respiratory phasicity and response to augmentation.

Subclavian Vein: No evidence of thrombus. Normal compressibility,
respiratory phasicity and response to augmentation.

Axillary Vein: No evidence of thrombus. Normal compressibility,
respiratory phasicity and response to augmentation.

Cephalic Vein: No evidence of thrombus. Normal compressibility,
respiratory phasicity and response to augmentation.

Basilic Vein: No evidence of thrombus. Normal compressibility,
respiratory phasicity and response to augmentation.

Brachial Veins: No evidence of thrombus. Normal compressibility,
respiratory phasicity and response to augmentation.

Radial Veins: No evidence of thrombus. Normal compressibility,
respiratory phasicity and response to augmentation.

Ulnar Veins: No evidence of thrombus. Normal compressibility,
respiratory phasicity and response to augmentation.

Venous Reflux:  None visualized.

Other Findings:  None visualized.
IMPRESSION: No evidence of DVT within the right upper extremity.
